# Patient Record
Sex: Female | Born: 1995 | Race: White | Hispanic: No | Marital: Single | Smoking: Never smoker
Health system: Southern US, Community
[De-identification: ages and names within clinical notes are randomized; demographics above are authoritative.]

## PROBLEM LIST (undated history)

## (undated) DIAGNOSIS — E282 Polycystic ovarian syndrome: Secondary | ICD-10-CM

## (undated) DIAGNOSIS — A1801 Tuberculosis of spine: Secondary | ICD-10-CM

## (undated) HISTORY — PX: MOUTH SURGERY: SHX715

---

## 2016-02-12 ENCOUNTER — Emergency Department: Payer: Worker's Compensation

## 2016-02-12 ENCOUNTER — Emergency Department
Admission: EM | Admit: 2016-02-12 | Discharge: 2016-02-12 | Disposition: A | Discharge status: Home / Self Care | Payer: Worker's Compensation | Attending: Emergency Medicine | Admitting: Emergency Medicine

## 2016-02-12 ENCOUNTER — Encounter: Payer: Self-pay | Admitting: Emergency Medicine

## 2016-02-12 DIAGNOSIS — Y9389 Activity, other specified: Secondary | ICD-10-CM | POA: Insufficient documentation

## 2016-02-12 DIAGNOSIS — S8392XA Sprain of unspecified site of left knee, initial encounter: Secondary | ICD-10-CM | POA: Insufficient documentation

## 2016-02-12 DIAGNOSIS — Y9269 Other specified industrial and construction area as the place of occurrence of the external cause: Secondary | ICD-10-CM | POA: Diagnosis not present

## 2016-02-12 DIAGNOSIS — W108XXA Fall (on) (from) other stairs and steps, initial encounter: Secondary | ICD-10-CM | POA: Diagnosis not present

## 2016-02-12 DIAGNOSIS — Y990 Civilian activity done for income or pay: Secondary | ICD-10-CM | POA: Diagnosis not present

## 2016-02-12 DIAGNOSIS — S8992XA Unspecified injury of left lower leg, initial encounter: Secondary | ICD-10-CM | POA: Diagnosis present

## 2016-02-12 DIAGNOSIS — Y929 Unspecified place or not applicable: Secondary | ICD-10-CM | POA: Diagnosis not present

## 2016-02-12 DIAGNOSIS — S8002XA Contusion of left knee, initial encounter: Secondary | ICD-10-CM

## 2016-02-12 MED ORDER — IBUPROFEN 800 MG PO TABS: 800.0000 mg | ORAL_TABLET | Freq: Three times a day (TID) | ORAL | Status: DC | PRN

## 2016-02-12 MED ORDER — IBUPROFEN 800 MG PO TABS
800.0000 mg | ORAL_TABLET | Freq: Once | ORAL | Status: DC
  Filled 2016-02-12: qty 1

## 2016-02-12 MED ORDER — OXYCODONE-ACETAMINOPHEN 5-325 MG PO TABS: 1.0000 | ORAL_TABLET | Freq: Four times a day (QID) | ORAL | Status: DC | PRN

## 2016-02-12 NOTE — ED Provider Notes (Signed)
[facility] Emergency Department Provider Note   ____________________________________________  Time seen: Approximately 4:50 PM  I have reviewed the triage vital signs and the nursing notes.   HISTORY  Chief Complaint Knee Pain     HPI Martha Villegas is a 20 y.o. female who fell earlier today at work, [employer], injuring her left knee. Unable to bear weight.She fell into a bar that circles a stepladder. Pain with flexion. Also loud cracking with flexion. No pain to her ankle or hip. No prior history of knee problems, however she does remember having some crepitus to her knee with flexion in the past.   History reviewed. No pertinent past medical history.  There are no active problems to display for this patient.   Past Surgical History  Procedure Laterality Date  . Mouth surgery      Current Outpatient Rx  Name  Route  Sig  Dispense  Refill  . ibuprofen (ADVIL,MOTRIN) 800 MG tablet   Oral   Take 1 tablet (800 mg total) by mouth every 8 (eight) hours as needed.   15 tablet   0   . oxyCODONE-acetaminophen (ROXICET) 5-325 MG tablet   Oral   Take 1 tablet by mouth every 6 (six) hours as needed.   10 tablet   0     Allergies Review of patient's allergies indicates no known allergies.  No family history on file.  Social History Social History  Substance Use Topics  . Smoking status: Never Smoker   . Smokeless tobacco: None  . Alcohol Use: No    Review of Systems Constitutional: No fever/chills Eyes: No visual changes. ENT: No sore throat. Cardiovascular: Denies chest pain. Respiratory: Denies shortness of breath. Gastrointestinal: No abdominal pain.  No nausea, no vomiting.  No diarrhea.  No constipation. Genitourinary: Negative for dysuria. Musculoskeletal: Negative for back pain. Skin: Negative for rash. Neurological: Negative for headaches, focal weakness or numbness. 10-point ROS otherwise  negative.  ____________________________________________   PHYSICAL EXAM:  VITAL SIGNS: ED Triage Vitals  Enc Vitals Group     BP 02/12/16 1533 130/72 mmHg     Pulse Rate 02/12/16 1533 100     Resp 02/12/16 1533 16     Temp 02/12/16 1533 98.8 F (37.1 C)     Temp Source 02/12/16 1533 Oral     SpO2 02/12/16 1533 100 %     Weight 02/12/16 1533 200 lb (90.719 kg)     Height 02/12/16 1533  (1.676 m)     Head Cir --      Peak Flow --      Pain Score 02/12/16 1544 5     Pain Loc --      Pain Edu? --      Excl. in GC? --     Constitutional: Alert and oriented. Well appearing and in no acute distress. Eyes: Conjunctivae are normal.  Mouth/Throat: Mucous membranes are moist.   Musculoskeletal: Left knee: Tender to the medial aspect of the patella with crepitus on range of motion. Mild medial and lateral joint line tenderness. Able to flex to 120. Minimal patellar tenderness with full flexion. Neurologic:  Normal speech and language. No gross focal neurologic deficits are appreciated. No gait instability. Skin:  Skin is warm, dry and intact. No rash noted. Psychiatric: Mood and affect are normal. Speech and behavior are normal.  ____________________________________________   LABS (all labs ordered are listed, but only abnormal results are displayed)  Labs Reviewed - No data  to display ____________________________________________  EKG   ____________________________________________  RADIOLOGY  CLINICAL DATA: 20 year old female with acute anterior left knee pain following fall today. Initial encounter.  EXAM: LEFT KNEE - COMPLETE 4+ VIEW  COMPARISON: None.  FINDINGS: No evidence of fracture, dislocation, or joint effusion. No evidence of arthropathy or other focal bone abnormality. Soft tissues are unremarkable.  IMPRESSION: Negative.   Electronically Signed  By: Harmon PierJeffrey Hu M.D.  On: 02/12/2016  16:19 ____________________________________________   PROCEDURES  Procedure(s) performed: None  Critical Care performed: No  ____________________________________________   INITIAL IMPRESSION / ASSESSMENT AND PLAN / ED COURSE  Pertinent labs & imaging results that were available during my care of the patient were reviewed by me and considered in my medical decision making (see chart for details).  20 year old female who injured her left knee this afternoon at work. Negative x-rays as above. Suffered a contusion with a knee sprain. Placed in a knee immobilizer. Continue ice and ibuprofen. Also given crutches. He can follow-up with orthopedics next week. Given a work note through Monday. 02/14/16.  ____________________________________________   FINAL CLINICAL IMPRESSION(S) / ED DIAGNOSES  Final diagnoses:  Knee contusion, left, initial encounter  Knee sprain, left, initial encounter      Ignacia BayleyRobert Zhamir Pirro, PA-C 02/12/16 1709  Jeanmarie PlantJames A McShane, MD 02/12/16 [phone]

## 2016-02-12 NOTE — ED Notes (Signed)
Patient presents to the ED with left knee pain.  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  Patient is in no obvious distress at this time.  Limping on knee.

## 2016-02-12 NOTE — ED Notes (Signed)
E sig pad not working, pt verbalized understanding 

## 2016-02-12 NOTE — Discharge Instructions (Signed)
Contusion A contusion is a deep bruise. Contusions are the result of a blunt injury to tissues and muscle fibers under the skin. The injury causes bleeding under the skin. The skin overlying the contusion may turn blue, purple, or yellow. Minor injuries will give you a painless contusion, but more severe contusions may stay painful and swollen for a few weeks.  CAUSES  This condition is usually caused by a blow, trauma, or direct force to an area of the body. SYMPTOMS  Symptoms of this condition include:  Swelling of the injured area.  Pain and tenderness in the injured area.  Discoloration. The area may have redness and then turn blue, purple, or yellow. DIAGNOSIS  This condition is diagnosed based on a physical exam and medical history. An X-ray, CT scan, or MRI may be needed to determine if there are any associated injuries, such as broken bones (fractures). TREATMENT  Specific treatment for this condition depends on what area of the body was injured. In general, the best treatment for a contusion is resting, icing, applying pressure to (compression), and elevating the injured area. This is often called the RICE strategy. Over-the-counter anti-inflammatory medicines may also be recommended for pain control.  HOME CARE INSTRUCTIONS   Rest the injured area.  If directed, apply ice to the injured area:  Put ice in a plastic bag.  Place a towel between your skin and the bag.  Leave the ice on for 20 minutes, 2-3 times per day.  If directed, apply light compression to the injured area using an elastic bandage. Make sure the bandage is not wrapped too tightly. Remove and reapply the bandage as directed by your health care provider.  If possible, raise (elevate) the injured area above the level of your heart while you are sitting or lying down.  Take over-the-counter and prescription medicines only as told by your health care provider. SEEK MEDICAL CARE IF:  Your symptoms do not  improve after several days of treatment.  Your symptoms get worse.  You have difficulty moving the injured area. SEEK IMMEDIATE MEDICAL CARE IF:   You have severe pain.  You have numbness in a hand or foot.  Your hand or foot turns pale or cold.   This information is not intended to replace advice given to you by your health care provider. Make sure you discuss any questions you have with your health care provider.   Document Released: 06/07/2005 Document Revised: 05/19/2015 Document Reviewed: 01/13/2015 Elsevier Interactive Patient Education 2016 [employer]  Knee Sprain A knee sprain is a tear in the strong bands of tissue that connect the bones (ligaments) of your knee. HOME CARE  Raise (elevate) your injured knee to lessen puffiness (swelling).  To ease pain and puffiness, put ice on the injured area.  Put ice in a plastic bag.  Place a towel between your skin and the bag.  Leave the ice on for 20 minutes, 2-3 times a day.  Only take medicine as told by your doctor.  Do not leave your knee unprotected until pain and stiffness go away (usually 4-6 weeks).  If you have a cast or splint, do not get it wet. If your doctor told you to not take it off, cover it with a plastic bag when you shower or bathe. Do not swim.  Your doctor may have you do exercises to prevent or limit permanent weakness and stiffness. GET HELP RIGHT AWAY IF:   Your cast or splint becomes damaged.  Your pain gets worse.  You have a lot of pain, puffiness, or numbness below the cast or splint. MAKE SURE YOU:   Understand these instructions.  Will watch your condition.  Will get help right away if you are not doing well or get worse.   This information is not intended to replace advice given to you by your health care provider. Make sure you discuss any questions you have with your health care provider.   Document Released: 08/16/2009 Document Revised: 09/02/2013 Document Reviewed:  05/06/2013 Elsevier Interactive Patient Education 2016 Elsevier Inc.   Continue ice, ibuprofen and knee immobilizer. Contact the orthopedics next week for a follow-up appointment. Return to emergency for any concerns.

## 2016-02-12 NOTE — ED Notes (Signed)
Workers comp Completed.

## 2016-02-12 NOTE — ED Notes (Addendum)
Pt states she banged her knee on a step ladder around 2pm today while at work.  Pt states the pain subsides as she elevates it.  Pt can bear some weight to knee. Pt also states she cannot swallow pill.

## 2019-01-14 ENCOUNTER — Other Ambulatory Visit: Payer: Self-pay | Admitting: Family Medicine

## 2019-01-14 DIAGNOSIS — R591 Generalized enlarged lymph nodes: Secondary | ICD-10-CM

## 2019-01-20 ENCOUNTER — Ambulatory Visit
Admission: RE | Admit: 2019-01-20 | Discharge: 2019-01-20 | Disposition: A | Discharge status: Home / Self Care | Payer: 59 | Source: Ambulatory Visit | Attending: Family Medicine | Admitting: Family Medicine

## 2019-01-20 ENCOUNTER — Other Ambulatory Visit: Payer: Self-pay

## 2019-01-20 DIAGNOSIS — R591 Generalized enlarged lymph nodes: Secondary | ICD-10-CM | POA: Insufficient documentation

## 2019-05-12 IMAGING — US SOFT TISSUE ULTRASOUND HEAD/NECK
1 series · 14 of 25 positions shown · non-contrast
Comparison: None.

CLINICAL DATA: 23-year-old female with a history of lymphadenopathy

EXAM:
ULTRASOUND OF HEAD/NECK SOFT TISSUES
TECHNIQUE: Ultrasound examination of the head and neck soft tissues was
performed in the area of clinical concern.

[Series 1: soft tissue ultrasound head/neck · 0.07mm/px · 14 of 25 slices shown]
[im 1/25]
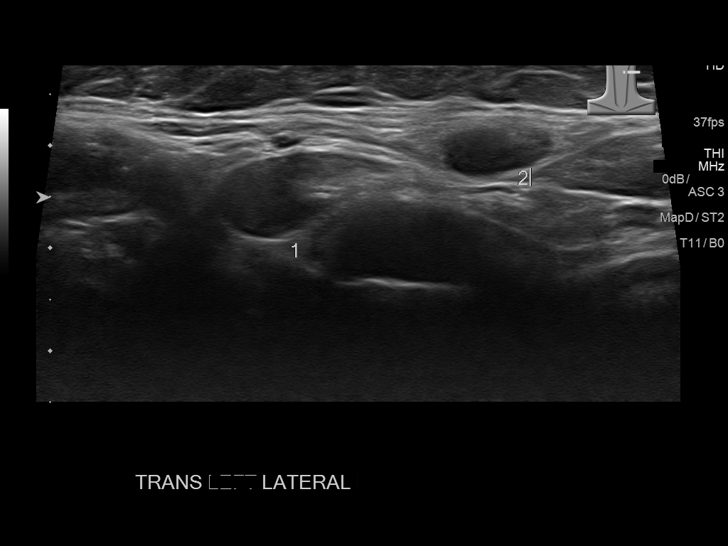
[im 3/25]
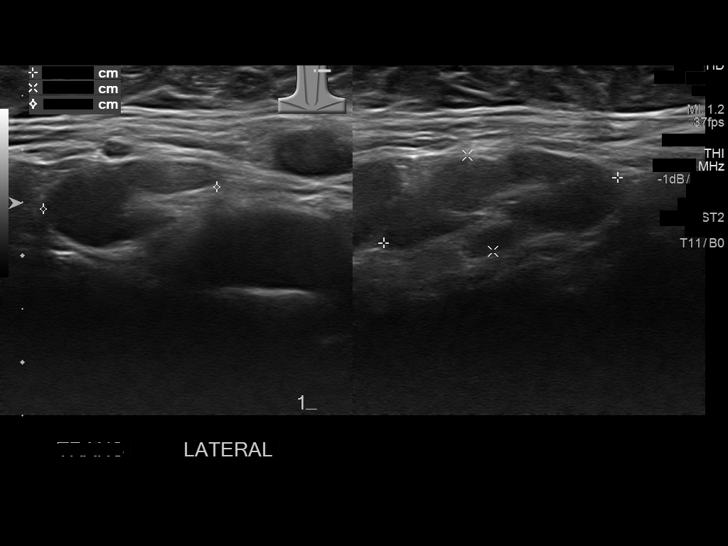
[im 5/25]
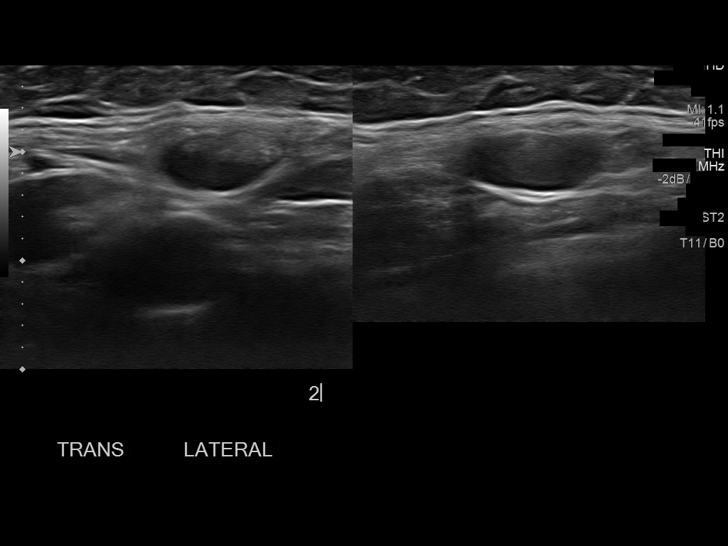
[im 7/25]
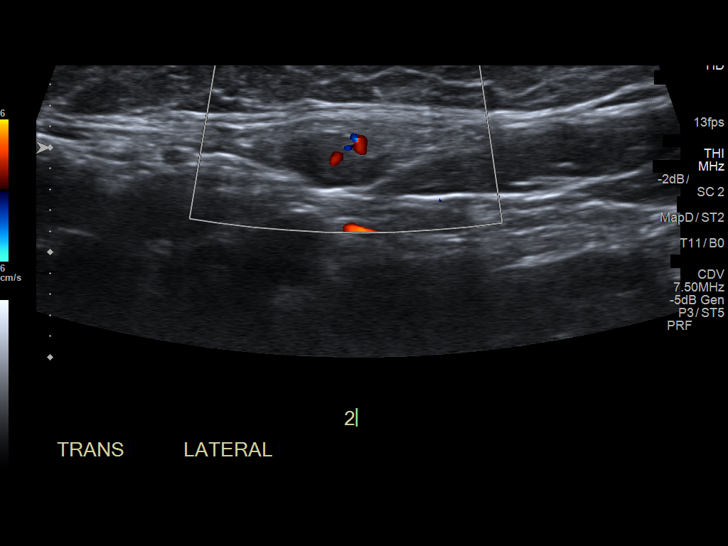
[im 9/25]
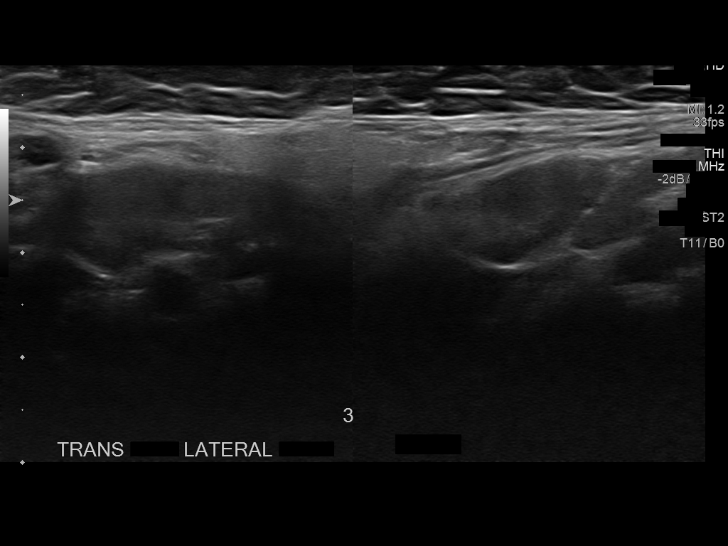
[im 10/25]
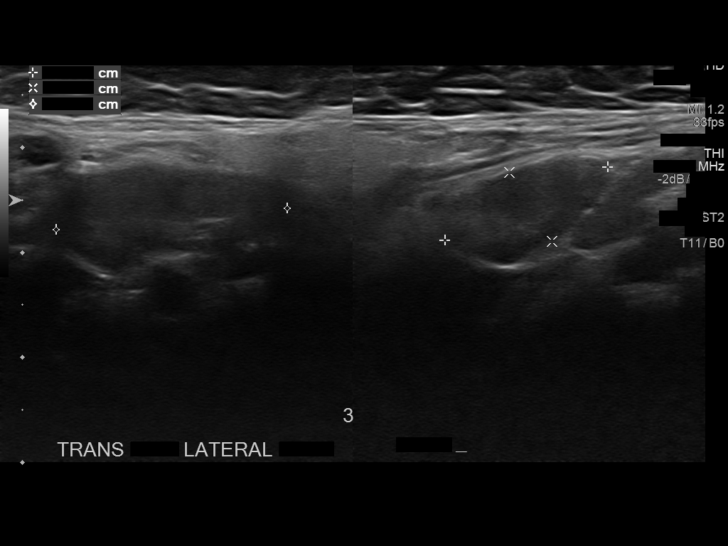
[im 12/25]
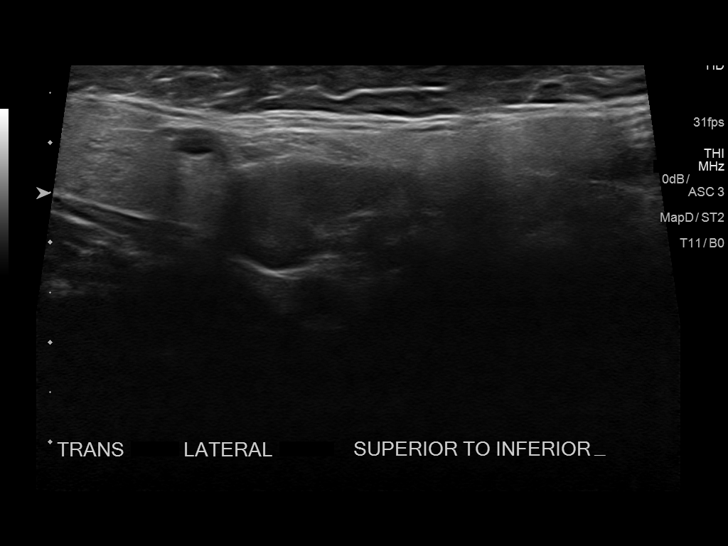
[im 14/25]
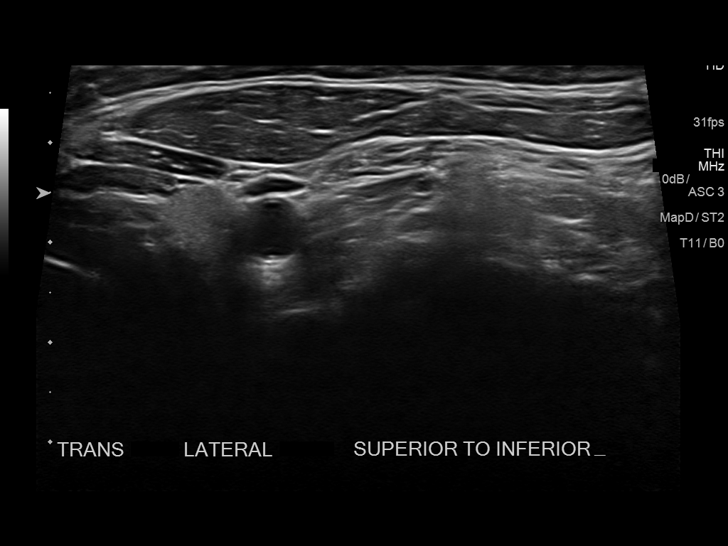
[im 16/25]
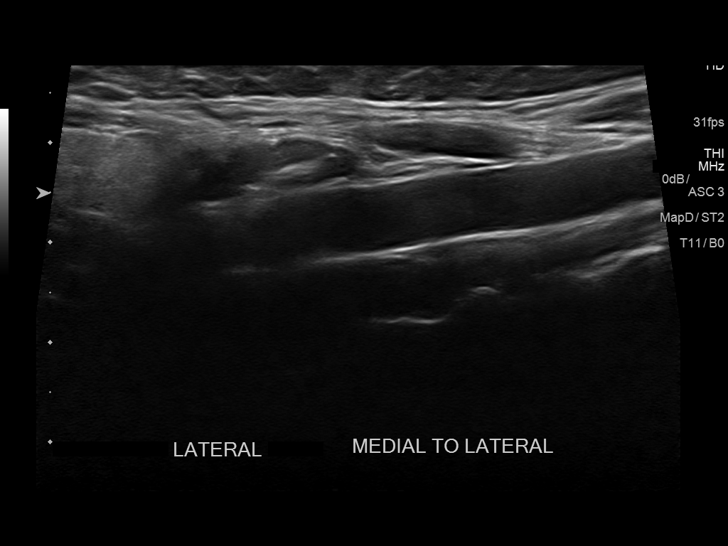
[im 17/25]
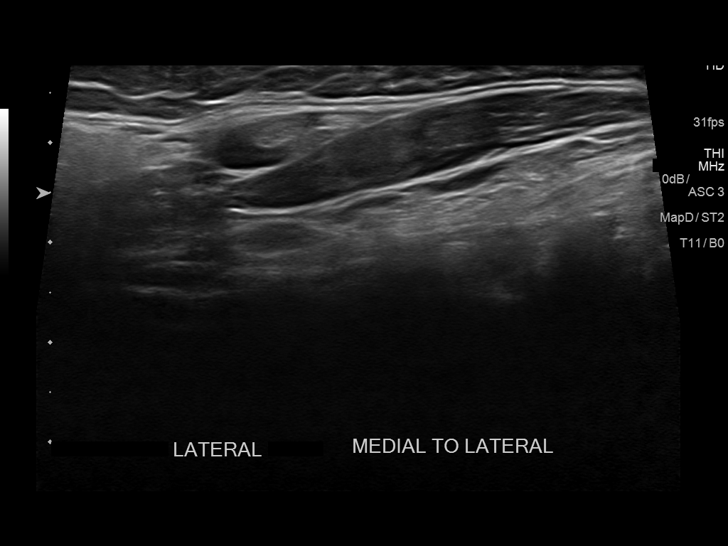
[im 19/25]
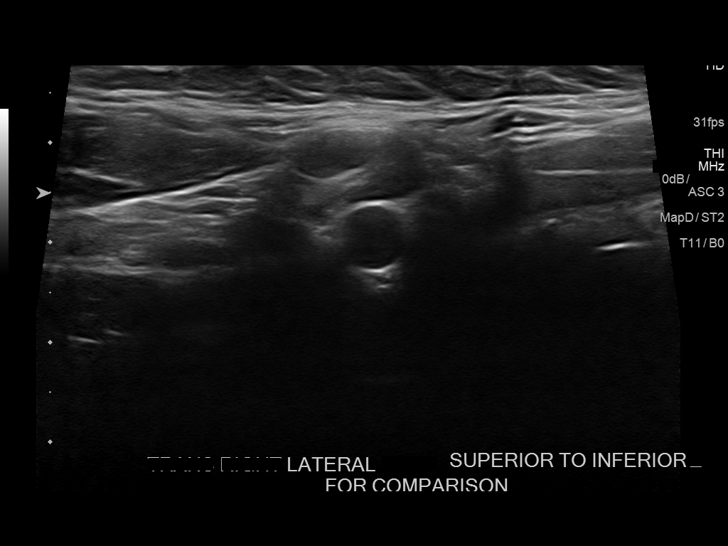
[im 21/25]
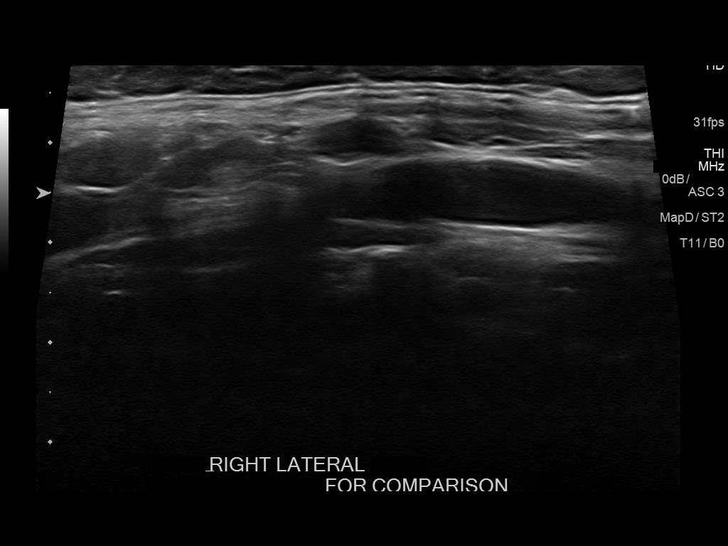
[im 23/25]
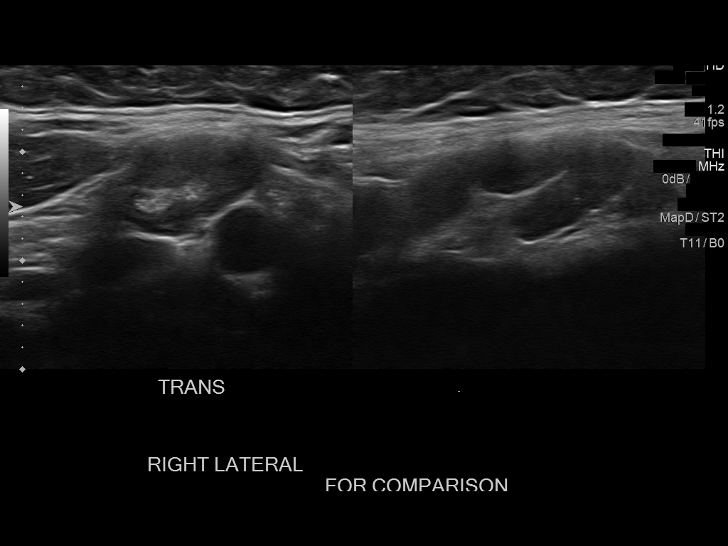
[im 25/25]
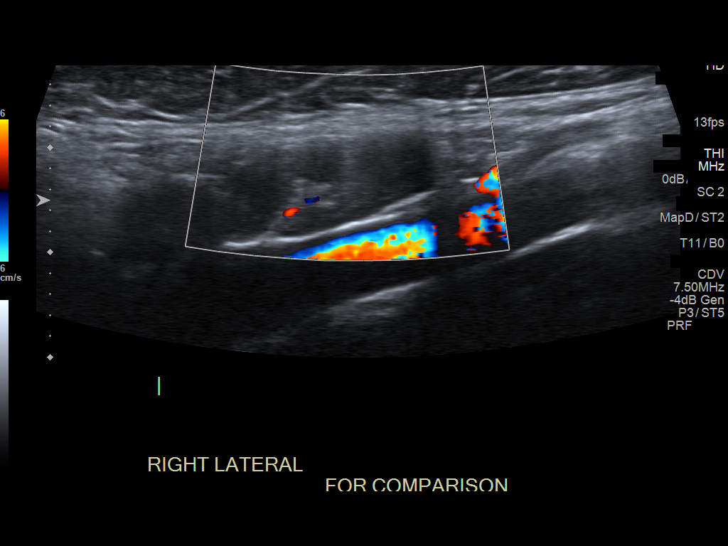

[14 of 25 positions shown; findings below may reference images not displayed]

FINDINGS: Grayscale and color duplex performed in the region of clinical
concern. No focal fluid. No soft tissue lesion.

Left-sided lymph nodes borderline enlarged with typical architecture
maintained. Single right-sided cervical node measured, borderline
enlarged.
IMPRESSION: Sonographic survey demonstrates bilateral cervical lymph nodes, more
numerous on the left and the right, borderline enlarged with typical
architecture maintained. These are nonspecific, and may potentially
be reactive. Correlation with presentation and lab values may be
useful if there is concern for lymphoproliferative disorder

## 2020-11-30 ENCOUNTER — Other Ambulatory Visit: Payer: Self-pay

## 2020-11-30 ENCOUNTER — Ambulatory Visit
Admission: EM | Admit: 2020-11-30 | Discharge: 2020-11-30 | Disposition: A | Discharge status: Home / Self Care | Payer: 59 | Attending: Sports Medicine | Admitting: Sports Medicine

## 2020-11-30 DIAGNOSIS — R059 Cough, unspecified: Secondary | ICD-10-CM

## 2020-11-30 DIAGNOSIS — J301 Allergic rhinitis due to pollen: Secondary | ICD-10-CM

## 2020-11-30 DIAGNOSIS — R0981 Nasal congestion: Secondary | ICD-10-CM

## 2020-11-30 DIAGNOSIS — J069 Acute upper respiratory infection, unspecified: Secondary | ICD-10-CM

## 2020-11-30 MED ORDER — BENZONATATE 100 MG PO CAPS: 200.0000 mg | ORAL_CAPSULE | Freq: Three times a day (TID) | ORAL | 0 refills | Status: DC | PRN

## 2020-11-30 NOTE — ED Triage Notes (Signed)
Patient states that she has been having sinus pain and pressure, headaches, dizziness, cough and runny nose. States that she is now having left upper quadrant abdominal pain.

## 2020-11-30 NOTE — ED Provider Notes (Signed)
[facility]    CSN: [id number] Arrival date & time: 11/30/20  0808      History   Chief Complaint Chief Complaint  Patient presents with  . Facial Pain    HPI Martha Villegas is a 25 y.o. female.   Patient pleasant 25 year old female who presents for evaluation of the above issues.  She reports upper respiratory symptoms with sinus pressure, nasal congestion, drainage, rhinorrhea, ear pressure, throat irritation, and cough with postnasal drip.  The cough is not productive.  No fever shakes chills.  She says she feels a little bit nauseous from the postnasal drip.  No vomiting or diarrhea.  No fever shakes chills.  No COVID exposure or COVID history.  She has been vaccinated x2 but no booster.  No flu shot.  She also reports a little bit of left-sided lower chest pain over the 11th and 12th ribs.  It only occurs when she coughs.  She also reports some seasonal allergies but she does not take any medications for it.  She is requesting a work note and she works at [employer].  She did have a home COVID test 2 weeks ago and does not want to pursue that today.  No chest pain or shortness of breath.  No red flag signs or symptoms elicited on history.     History reviewed. No pertinent past medical history.  There are no problems to display for this patient.   Past Surgical History:  Procedure Laterality Date  . MOUTH SURGERY      OB History   No obstetric history on file.      Home Medications    Prior to Admission medications   Medication Sig Start Date End Date Taking? Authorizing Provider  benzonatate (TESSALON) 100 MG capsule Take 2 capsules (200 mg total) by mouth 3 (three) times daily as needed for cough. 11/30/20  Yes Delton See, MD  ibuprofen (ADVIL,MOTRIN) 800 MG tablet Take 1 tablet (800 mg total) by mouth every 8 (eight) hours as needed. 02/12/16   Ignacia Bayley, PA-C  oxyCODONE-acetaminophen (ROXICET) 5-325 MG tablet Take 1 tablet by mouth every 6 (six)  hours as needed. 02/12/16   Ignacia Bayley, PA-C    Family History Family History  Problem Relation Age of Onset  . Healthy Mother   . Healthy Father     Social History Social History   Tobacco Use  . Smoking status: Never Smoker  . Smokeless tobacco: Never Used  Vaping Use  . Vaping Use: Some days  . Substances: CBD  Substance Use Topics  . Alcohol use: No  . Drug use: Never     Allergies   Patient has no known allergies.   Review of Systems Review of Systems  Constitutional: Negative for activity change, appetite change, chills, diaphoresis, fatigue and fever.  HENT: Positive for congestion, ear pain, postnasal drip, rhinorrhea, sinus pressure and sore throat. Negative for ear discharge, sinus pain and sneezing.   Eyes: Negative.  Negative for pain.  Respiratory: Positive for cough. Negative for chest tightness, shortness of breath, wheezing and stridor.        Left-sided lower rib pain anteriorly with coughing.  No real chest pain or chest tightness.  Cardiovascular: Negative.  Negative for chest pain and palpitations.  Gastrointestinal: Negative.  Negative for abdominal pain, constipation, diarrhea, nausea and vomiting.  Genitourinary: Negative.  Negative for dysuria, flank pain, frequency, hematuria, pelvic pain, urgency, vaginal bleeding, vaginal discharge and vaginal pain.  Musculoskeletal: Negative.  Negative  for arthralgias, back pain, myalgias, neck pain and neck stiffness.  Skin: Negative.  Negative for color change, rash and wound.  Neurological: Negative.  Negative for dizziness, syncope, light-headedness, numbness and headaches.  All other systems reviewed and are negative.    Physical Exam Triage Vital Signs ED Triage Vitals  Enc Vitals Group     BP 11/30/20 0817 129/86     Pulse Rate 11/30/20 0817 90     Resp 11/30/20 0817 18     Temp 11/30/20 0817 98.7 F (37.1 C)     Temp Source 11/30/20 0817 Oral     SpO2 11/30/20 0817 100 %     Weight 11/30/20  0818 199 lb 15.3 oz (90.7 kg)     Height 11/30/20 0818 5\' 6"  (1.676 m)     Head Circumference --      Peak Flow --      Pain Score 11/30/20 0818 5     Pain Loc --      Pain Edu? --      Excl. in GC? --    No data found.  Updated Vital Signs BP 129/86 (BP Location: Right Arm)   Pulse 90   Temp 98.7 F (37.1 C) (Oral)   Resp 18   Ht 5\' 6"  (1.676 m)   Wt 90.7 kg   LMP 11/16/2020   SpO2 100%   BMI 32.27 kg/m   Visual Acuity Right Eye Distance:   Left Eye Distance:   Bilateral Distance:    Right Eye Near:   Left Eye Near:    Bilateral Near:     Physical Exam Vitals and nursing note reviewed.  Constitutional:      General: She is not in acute distress.    Appearance: Normal appearance. She is not ill-appearing, toxic-appearing or diaphoretic.  HENT:     Head: Normocephalic and atraumatic.     Right Ear: Tympanic membrane normal.     Left Ear: Tympanic membrane normal.     Nose: Congestion and rhinorrhea present.     Mouth/Throat:     Mouth: Mucous membranes are moist.     Pharynx: No oropharyngeal exudate or posterior oropharyngeal erythema.  Eyes:     General: No scleral icterus.       Right eye: No discharge.        Left eye: No discharge.     Extraocular Movements: Extraocular movements intact.     Conjunctiva/sclera: Conjunctivae normal.     Pupils: Pupils are equal, round, and reactive to light.  Cardiovascular:     Rate and Rhythm: Normal rate.     Pulses: Normal pulses.     Heart sounds: Normal heart sounds. No murmur heard. No friction rub. No gallop.   Pulmonary:     Effort: Pulmonary effort is normal. No respiratory distress.     Breath sounds: Normal breath sounds. No stridor. No wheezing, rhonchi or rales.  Musculoskeletal:     Cervical back: Normal range of motion and neck supple. No rigidity or tenderness.  Lymphadenopathy:     Cervical: Cervical adenopathy present.  Skin:    General: Skin is warm and dry.     Capillary Refill: Capillary  refill takes less than 2 seconds.     Findings: No lesion or rash.  Neurological:     General: No focal deficit present.     Mental Status: She is alert and oriented to person, place, and time.      UC Treatments / Results  Labs (all labs ordered are listed, but only abnormal results are displayed) Labs Reviewed - No data to display  EKG   Radiology No results found.  Procedures Procedures (including critical care time)  Medications Ordered in UC Medications - No data to display  Initial Impression / Assessment and Plan / UC Course  I have reviewed the triage vital signs and the nursing notes.  Pertinent labs & imaging results that were available during my care of the patient were reviewed by me and considered in my medical decision making (see chart for details).  Clinical impression: Viral URI with nasal congestion, postnasal drip, throat irritation with a normal exam, and cough from the postnasal drip.  She also has anterior lower rib discomfort with coughing.  No shortness of breath or chest pain.  No abdominal issues either.  She also has what sounds like some seasonal allergies but does not take any medications.  Treatment plan: 1.  The findings and treatment plan were discussed in detail with the patient.  Patient was in agreement. 2.  We discussed doing a Covid test but she is self-pay and she deferred on this. 3.  Educational handouts were provided. 4.  Sent a prescription for [employer]. 5.  I recommended over-the-counter allergy medications. 6.  We discussed the diagnosis of sinusitis and she does not meet the criteria.  I will give her some educational handouts on this and she will do saline flushes.  I encouraged her to purchase a Nettie pot. 7.  Over-the-counter meds as needed, cough syrup such as Delsym or Robitussin.  Tylenol or Motrin for any fever or discomfort. 8.  I encouraged her to get into her primary care provider if her symptoms persisted. 9.   She requested a work note I provided 1 she will be out today and tomorrow. 10.  Follow-up here as needed.    Final Clinical Impressions(s) / UC Diagnoses   Final diagnoses:  Acute upper respiratory infection  Cough  Nasal congestion  Seasonal allergic rhinitis due to pollen     Discharge Instructions     Your exam is consistent with a viral URI. I provided educational handouts. I also sent a prescription for Tessalon Perles for your cough. Over-the-counter meds as needed, Tylenol or Motrin for fever discomfort.  I would encourage you to get some Delsym or Robitussin for your cough. I also would encourage you to get some allergy medication because you have some allergic rhinitis. Please follow-up with your primary care provider if your symptoms persist.  You do not meet the criteria for sinusitis and no antibiotic.  I have included some educational handouts on sinus rinses. I also provided you with a work note.  I hope you get the feeling better, Dr. Zachery Dauer    ED Prescriptions    Medication Sig Dispense Auth. Provider   benzonatate (TESSALON) 100 MG capsule Take 2 capsules (200 mg total) by mouth 3 (three) times daily as needed for cough. 21 capsule Delton See, MD     PDMP not reviewed this encounter.   Delton See, MD 11/30/20 [phone]

## 2020-11-30 NOTE — Discharge Instructions (Signed)
Your exam is consistent with a viral URI. I provided educational handouts. I also sent a prescription for Tessalon Perles for your cough. Over-the-counter meds as needed, Tylenol or Motrin for fever discomfort.  I would encourage you to get some Delsym or Robitussin for your cough. I also would encourage you to get some allergy medication because you have some allergic rhinitis. Please follow-up with your primary care provider if your symptoms persist.  You do not meet the criteria for sinusitis and no antibiotic.  I have included some educational handouts on sinus rinses. I also provided you with a work note.  I hope you get the feeling better, Dr. Zachery Dauer

## 2021-08-26 ENCOUNTER — Ambulatory Visit
Admission: EM | Admit: 2021-08-26 | Discharge: 2021-08-26 | Disposition: A | Discharge status: Home / Self Care | Payer: 59 | Attending: Emergency Medicine | Admitting: Emergency Medicine

## 2021-08-26 ENCOUNTER — Other Ambulatory Visit: Payer: Self-pay

## 2021-08-26 DIAGNOSIS — J111 Influenza due to unidentified influenza virus with other respiratory manifestations: Secondary | ICD-10-CM

## 2021-08-26 MED ORDER — IPRATROPIUM BROMIDE 0.06 % NA SOLN: 2.0000 | Freq: Four times a day (QID) | NASAL | 12 refills | Status: AC

## 2021-08-26 MED ORDER — PROMETHAZINE-DM 6.25-15 MG/5ML PO SYRP: 5.0000 mL | ORAL_SOLUTION | Freq: Four times a day (QID) | ORAL | 0 refills | Status: AC | PRN

## 2021-08-26 MED ORDER — BENZONATATE 100 MG PO CAPS: 200.0000 mg | ORAL_CAPSULE | Freq: Three times a day (TID) | ORAL | 0 refills | Status: AC

## 2021-08-26 MED ORDER — OSELTAMIVIR PHOSPHATE 6 MG/ML PO SUSR: 75.0000 mg | Freq: Two times a day (BID) | ORAL | 0 refills | Status: AC

## 2021-08-26 NOTE — ED Provider Notes (Signed)
[facility]    CSN: [id number] Arrival date & time: 08/26/21  0808      History   Chief Complaint Chief Complaint  Patient presents with   Fever    HPI Martha Villegas is a 25 y.o. female.   HPI  History reviewed. No pertinent past medical history.  There are no problems to display for this patient.   25 year old female here for evaluation of flulike symptoms.  Patient reports that for last 3 days she has been experiencing a fever with a T-max of 102, sore throat, headache, body aches, runny nose and nasal congestion, cough that is intermittently productive for yellow sputum, shortness of breath, and some nausea.  She is attributing the nausea to the DayQuil that she takes as it always makes her nauseous.  She has had some diarrhea.  She denies ear pain, wheezing, or vomiting.  Past Surgical History:  Procedure Laterality Date   MOUTH SURGERY      OB History   No obstetric history on file.      Home Medications    Prior to Admission medications   Medication Sig Start Date End Date Taking? Authorizing Provider  benzonatate (TESSALON) 100 MG capsule Take 2 capsules (200 mg total) by mouth every 8 (eight) hours. 08/26/21  Yes Becky Augusta, NP  ipratropium (ATROVENT) 0.06 % nasal spray Place 2 sprays into both nostrils 4 (four) times daily. 08/26/21  Yes Becky Augusta, NP  oseltamivir (TAMIFLU) 6 MG/ML SUSR suspension Take 12.5 mLs (75 mg total) by mouth 2 (two) times daily for 5 days. 08/26/21 08/31/21 Yes Becky Augusta, NP  promethazine-dextromethorphan (PROMETHAZINE-DM) 6.25-15 MG/5ML syrup Take 5 mLs by mouth 4 (four) times daily as needed. 08/26/21  Yes Becky Augusta, NP    Family History Family History  Problem Relation Age of Onset   Healthy Mother    Healthy Father     Social History Social History   Tobacco Use   Smoking status: Never   Smokeless tobacco: Never  Vaping Use   Vaping Use: Some days   Substances: CBD  Substance Use Topics    Alcohol use: No   Drug use: Never     Allergies   Patient has no known allergies.   Review of Systems Review of Systems  Constitutional:  Positive for fever. Negative for activity change and appetite change.  HENT:  Positive for congestion, postnasal drip, rhinorrhea and sore throat. Negative for ear pain.   Respiratory:  Positive for cough and shortness of breath. Negative for wheezing.   Gastrointestinal:  Positive for diarrhea and nausea. Negative for vomiting.  Musculoskeletal:  Positive for arthralgias and myalgias.  Skin:  Negative for rash.  Neurological:  Positive for headaches.  Hematological: Negative.   Psychiatric/Behavioral: Negative.      Physical Exam Triage Vital Signs ED Triage Vitals  Enc Vitals Group     BP 08/26/21 0819 119/85     Pulse Rate 08/26/21 0819 (!) 102     Resp 08/26/21 0819 18     Temp 08/26/21 0819 100 F (37.8 C)     Temp Source 08/26/21 0819 Oral     SpO2 08/26/21 0819 100 %     Weight --      Height 08/26/21 0819 5\' 6"  (1.676 m)     Head Circumference --      Peak Flow --      Pain Score 08/26/21 0818 7     Pain Loc --  Pain Edu? --      Excl. in GC? --    No data found.  Updated Vital Signs BP 119/85 (BP Location: Left Arm)    Pulse (!) 102    Temp 100 F (37.8 C) (Oral)    Resp 18    Ht 5\' 6"  (1.676 m)    LMP  (LMP Unknown)    SpO2 100%    BMI 32.27 kg/m   Visual Acuity Right Eye Distance:   Left Eye Distance:   Bilateral Distance:    Right Eye Near:   Left Eye Near:    Bilateral Near:     Physical Exam Vitals and nursing note reviewed.  Constitutional:      General: She is not in acute distress.    Appearance: Normal appearance. She is ill-appearing.  HENT:     Head: Normocephalic and atraumatic.     Right Ear: Tympanic membrane, ear canal and external ear normal. There is no impacted cerumen.     Left Ear: Tympanic membrane, ear canal and external ear normal. There is no impacted cerumen.     Nose:  Congestion and rhinorrhea present.     Mouth/Throat:     Mouth: Mucous membranes are moist.     Pharynx: Oropharynx is clear. Posterior oropharyngeal erythema present.  Cardiovascular:     Rate and Rhythm: Normal rate and regular rhythm.     Pulses: Normal pulses.     Heart sounds: Normal heart sounds. No murmur heard.   No gallop.  Pulmonary:     Effort: Pulmonary effort is normal.     Breath sounds: Normal breath sounds. No wheezing, rhonchi or rales.  Musculoskeletal:     Cervical back: Normal range of motion and neck supple.  Lymphadenopathy:     Cervical: No cervical adenopathy.  Skin:    General: Skin is warm and dry.     Capillary Refill: Capillary refill takes less than 2 seconds.     Findings: No erythema or rash.  Neurological:     General: No focal deficit present.     Mental Status: She is alert and oriented to person, place, and time.  Psychiatric:        Mood and Affect: Mood normal.        Behavior: Behavior normal.        Thought Content: Thought content normal.        Judgment: Judgment normal.     UC Treatments / Results  Labs (all labs ordered are listed, but only abnormal results are displayed) Labs Reviewed - No data to display  EKG   Radiology No results found.  Procedures Procedures (including critical care time)  Medications Ordered in UC Medications - No data to display  Initial Impression / Assessment and Plan / UC Course  I have reviewed the triage vital signs and the nursing notes.  Pertinent labs & imaging results that were available during my care of the patient were reviewed by me and considered in my medical decision making (see chart for details).  Patient is a very pleasant though ill-appearing 25 year old female here for evaluation of influenza-like symptoms as outlined HPI above.  On her physical exam she has pearly-gray tympanic membranes bilaterally with normal light reflex and clear external auditory canals.  Nasal mucosa is  edematous and mildly erythematous with clear nasal discharge in both nares.  Oropharyngeal exam reveals mild posterior oropharyngeal erythema with clear postnasal drip.  No cervical lymphadenopathy appreciated on exam.  Cardiopulmonary  exam feels clung sounds in all fields.  Patient's husband had similar symptoms but he was never tested and his symptoms have resolved.  Patient symptoms are consistent with influenza and I will treat her with Tamiflu twice daily for 5 days.  Patient is requesting liquid as she states that she cannot physically swallow pills as explained by her orthodontist because she has dental crowding and a small jaw and a large tongue.  Patient does not have any difficulty swallowing food per her report.  I have prescribed Tamiflu liquid form for the patient and have advised her that it may make her nausea worse.  I have also prescribed Atrovent nasal spray and Promethazine DM cough syrup.  I will prescribe Tessalon Ronal Fear today as well.  I have advised the patient that if she takes a mouthful of water retains fluid, drops the Tessalon Perles are often swallows she may be able to tolerate swallowing that gelcap without difficulty.  I also talked to her about desensitizing her gag reflex using her toothbrush.  If she is unable take the [employer] she can use Delsym, Zarbee's, or Robitussin during the day as needed for cough symptoms.  Also Tylenol and ibuprofen as needed for fever and body aches.  Work note provided.   Final Clinical Impressions(s) / UC Diagnoses   Final diagnoses:  Influenza-like illness     Discharge Instructions      Take the Tamiflu twice daily for 5 days for treatment of influenza.  Use the Atrovent nasal spray, 2 squirts up each nostril every 6 hours, as needed for nasal congestion and runny nose.  Use over-the-counter Delsym, Zarbee's, or Robitussin during the day as needed for cough.  Use the Tessalon Perles every 8 hours as needed for cough.   Taken with a small sip of water.  You may experience some numbness to your tongue or metallic taste in her mouth, this is normal.  Use the Promethazine DM cough syrup at bedtime as will make you drowsy but it should help dry up your postnasal drip and aid you in sleep and cough relief.  Return for reevaluation, or see your primary care provider, for new or worsening symptoms.      ED Prescriptions     Medication Sig Dispense Auth. Provider   oseltamivir (TAMIFLU) 6 MG/ML SUSR suspension Take 12.5 mLs (75 mg total) by mouth 2 (two) times daily for 5 days. 125 mL Becky Augusta, NP   benzonatate (TESSALON) 100 MG capsule Take 2 capsules (200 mg total) by mouth every 8 (eight) hours. 21 capsule Becky Augusta, NP   ipratropium (ATROVENT) 0.06 % nasal spray Place 2 sprays into both nostrils 4 (four) times daily. 15 mL Becky Augusta, NP   promethazine-dextromethorphan (PROMETHAZINE-DM) 6.25-15 MG/5ML syrup Take 5 mLs by mouth 4 (four) times daily as needed. 118 mL Becky Augusta, NP      PDMP not reviewed this encounter.   Becky Augusta, NP 08/26/21 [phone]

## 2021-08-26 NOTE — Discharge Instructions (Signed)
Take the Tamiflu twice daily for 5 days for treatment of influenza.  Use the Atrovent nasal spray, 2 squirts up each nostril every 6 hours, as needed for nasal congestion and runny nose.  Use over-the-counter Delsym, Zarbee's, or Robitussin during the day as needed for cough.  Use the Tessalon Perles every 8 hours as needed for cough.  Taken with a small sip of water.  You may experience some numbness to your tongue or metallic taste in her mouth, this is normal.  Use the Promethazine DM cough syrup at bedtime as will make you drowsy but it should help dry up your postnasal drip and aid you in sleep and cough relief.  Return for reevaluation, or see your primary care provider, for new or worsening symptoms.  

## 2021-08-26 NOTE — ED Triage Notes (Signed)
Pt here with C/O fever (102), cough and sore throat for 3 days. Husband was sick with same SX never got tested for the flu.

## 2023-12-12 ENCOUNTER — Ambulatory Visit: Admission: EM | Admit: 2023-12-12 | Discharge: 2023-12-12 | Disposition: A | Discharge status: Home / Self Care

## 2023-12-12 ENCOUNTER — Ambulatory Visit (INDEPENDENT_AMBULATORY_CARE_PROVIDER_SITE_OTHER)

## 2023-12-12 DIAGNOSIS — M25572 Pain in left ankle and joints of left foot: Secondary | ICD-10-CM

## 2023-12-12 DIAGNOSIS — M7732 Calcaneal spur, left foot: Secondary | ICD-10-CM

## 2023-12-12 DIAGNOSIS — M7750 Other enthesopathy of unspecified foot: Secondary | ICD-10-CM | POA: Diagnosis not present

## 2023-12-12 MED ORDER — IBUPROFEN 100 MG/5ML PO SUSP: 600.0000 mg | Freq: Three times a day (TID) | ORAL | 0 refills | Status: AC

## 2023-12-12 NOTE — ED Triage Notes (Signed)
 Pt reports she has had left ankle pain x 2 days making it difficult to walk. Pt denies injury

## 2023-12-12 NOTE — Discharge Instructions (Signed)
ANKLE PAIN: Stressed avoiding painful activities . Reviewed RICE guidelines. Use medications as directed, including NSAIDs. If no NSAIDs have been prescribed for you today, you may take Aleve or Motrin over the counter. May use Tylenol in between doses of NSAIDs.  If no improvement in the next 1-2 weeks, f/u with PCP or return to our office for reexamination, and please feel free to call or return at any time for any questions or concerns you may have and we will be happy to help you!

## 2023-12-12 NOTE — ED Provider Notes (Signed)
 [facility]    CSN: [id number] Arrival date & time: 12/12/23  1314      History   Chief Complaint No chief complaint on file.   HPI Martha Villegas is a 28 y.o. female presenting for left ankle pain for the past 2 days.  Patient states the pain started when she was just walking.  Denies fall or injury.  States she has a lot of pain in her heel and the anterior ankle.  No weakness, numbness.  Pain is worse with weightbearing but she is able to do so.  Has not taken any OTC meds.  Patient denies any new shoes or history of ankle pain.  Patient reports she is supposed to work tomorrow and she does not think she can.  Asked for work note.  HPI  History reviewed. No pertinent past medical history.  There are no active problems to display for this patient.   Past Surgical History:  Procedure Laterality Date   MOUTH SURGERY      OB History   No obstetric history on file.      Home Medications    Prior to Admission medications   Medication Sig Start Date End Date Taking? Authorizing Provider  ibuprofen (ADVIL) 100 MG/5ML suspension Take 30 mLs (600 mg total) by mouth every 8 (eight) hours for 7 days. 12/12/23 12/19/23 Yes Shirlee Latch, PA-C  Norethin-Eth Estradiol-Fe [facility] FE) 0.4-35 MG-MCG tablet Chew 1 tablet by mouth daily.   Yes [provider]  benzonatate (TESSALON) 100 MG capsule Take 2 capsules (200 mg total) by mouth every 8 (eight) hours. 08/26/21   Becky Augusta, NP  ipratropium (ATROVENT) 0.06 % nasal spray Place 2 sprays into both nostrils 4 (four) times daily. 08/26/21   Becky Augusta, NP  promethazine-dextromethorphan (PROMETHAZINE-DM) 6.25-15 MG/5ML syrup Take 5 mLs by mouth 4 (four) times daily as needed. 08/26/21   Becky Augusta, NP    Family History Family History  Problem Relation Age of Onset   Healthy Mother    Healthy Father     Social History Social History   Tobacco Use   Smoking status: Never   Smokeless tobacco: Never   Vaping Use   Vaping status: Some Days   Substances: CBD  Substance Use Topics   Alcohol use: No   Drug use: Never     Allergies   Tomato   Review of Systems Review of Systems  Musculoskeletal:  Positive for arthralgias. Negative for gait problem and joint swelling.  Skin:  Negative for color change, rash and wound.  Neurological:  Negative for weakness and numbness.     Physical Exam Triage Vital Signs ED Triage Vitals [12/12/23 1340]  Encounter Vitals Group     BP 130/84     Systolic BP Percentile      Diastolic BP Percentile      Pulse Rate 89     Resp 16     Temp 98 F (36.7 C)     Temp Source Oral     SpO2 99 %     Weight      Height      Head Circumference      Peak Flow      Pain Score 6     Pain Loc      Pain Education      Exclude from Growth Chart    No data found.  Updated Vital Signs BP 130/84 (BP Location: Left Arm)   Pulse 89  Temp 98 F (36.7 C) (Oral)   Resp 16   LMP 11/28/2023 (Approximate)   SpO2 99%      Physical Exam Vitals and nursing note reviewed.  Constitutional:      General: She is not in acute distress.    Appearance: Normal appearance. She is not ill-appearing or toxic-appearing.  HENT:     Head: Normocephalic and atraumatic.  Eyes:     General: No scleral icterus.       Right eye: No discharge.        Left eye: No discharge.     Conjunctiva/sclera: Conjunctivae normal.  Cardiovascular:     Rate and Rhythm: Normal rate.     Pulses: Normal pulses.  Pulmonary:     Effort: Pulmonary effort is normal. No respiratory distress.  Musculoskeletal:     Cervical back: Neck supple.     Comments: LEFT FOOT/ANKLE: No associated swelling. TTP anterior ankle diffusely and TTP heel. Painful plantarflexion and dorsiflexion. Good pulses and strength.   Skin:    General: Skin is dry.  Neurological:     General: No focal deficit present.     Mental Status: She is alert. Mental status is at baseline.     Motor: No weakness.      Gait: Gait normal.  Psychiatric:        Mood and Affect: Mood normal.        Behavior: Behavior normal.      UC Treatments / Results  Labs (all labs ordered are listed, but only abnormal results are displayed) Labs Reviewed - No data to display  EKG   Radiology DG Ankle Complete Left Result Date: 12/12/2023 CLINICAL DATA:  Left ankle pain for 2 days EXAM: LEFT ANKLE COMPLETE - 3+ VIEW COMPARISON:  None Available. FINDINGS: Frontal, oblique, and lateral views of the left ankle are obtained. No acute fracture, subluxation, or dislocation. Joint spaces are well preserved. Large inferior calcaneal spur. Soft tissues are unremarkable. IMPRESSION: 1. Large inferior calcaneal spur. 2. No acute bony abnormality. Electronically Signed   By: Sharlet Salina M.D.   On: 12/12/2023 15:38    Procedures Procedures (including critical care time)  Medications Ordered in UC Medications - No data to display  Initial Impression / Assessment and Plan / UC Course  I have reviewed the triage vital signs and the nursing notes.  Pertinent labs & imaging results that were available during my care of the patient were reviewed by me and considered in my medical decision making (see chart for details).   28 year old female presents for 2-day history of atraumatic left ankle pain.  Pain began when she was walking.  No OTC meds tried.  X-ray of left ankle obtained.  Reviewed by me.  There is evidence of heel spur.  Discussed this with patient.  Will contact her with official results of x-ray shows any abnormalities we did not discuss.  Offered ketorolac injection but she declines.  Request a liquid medication to help with pain relief.  I sent ibuprofen suspension to pharmacy.  Patient was given an Aircast.  Reviewed RICE guidelines.  Work note provided.  Reviewed return and Ortho precautions.  X-ray shows calcaneus spur.  No change to treatment plan.    Final Clinical Impressions(s) / UC Diagnoses   Final  diagnoses:  Acute left ankle pain  Calcaneal spur of left foot  Tendonitis of ankle     Discharge Instructions      ANKLE PAIN: Stressed avoiding painful activities .  Reviewed RICE guidelines. Use medications as directed, including NSAIDs. If no NSAIDs have been prescribed for you today, you may take Aleve or Motrin over the counter. May use Tylenol in between doses of NSAIDs.  If no improvement in the next 1-2 weeks, f/u with PCP or return to our office for reexamination, and please feel free to call or return at any time for any questions or concerns you may have and we will be happy to help you!         ED Prescriptions     Medication Sig Dispense Auth. Provider   ibuprofen (ADVIL) 100 MG/5ML suspension Take 30 mLs (600 mg total) by mouth every 8 (eight) hours for 7 days. 473 mL Shirlee Latch, PA-C      PDMP not reviewed this encounter.   Shirlee Latch, PA-C 12/12/23 1610

## 2024-07-12 ENCOUNTER — Emergency Department

## 2024-07-12 ENCOUNTER — Emergency Department
Admission: EM | Admit: 2024-07-12 | Discharge: 2024-07-12 | Disposition: A | Discharge status: Home / Self Care | Attending: Emergency Medicine | Admitting: Emergency Medicine

## 2024-07-12 ENCOUNTER — Other Ambulatory Visit: Payer: Self-pay

## 2024-07-12 DIAGNOSIS — R519 Headache, unspecified: Secondary | ICD-10-CM | POA: Insufficient documentation

## 2024-07-12 DIAGNOSIS — R002 Palpitations: Secondary | ICD-10-CM | POA: Insufficient documentation

## 2024-07-12 DIAGNOSIS — R000 Tachycardia, unspecified: Secondary | ICD-10-CM | POA: Diagnosis present

## 2024-07-12 HISTORY — DX: Tuberculosis of spine: A18.01

## 2024-07-12 HISTORY — DX: Polycystic ovarian syndrome: E28.2

## 2024-07-12 LAB — BASIC METABOLIC PANEL WITH GFR
Anion gap: 10 (ref 5–15)
BUN: 10 mg/dL (ref 6–20)
CO2: 24 mmol/L (ref 22–32)
Calcium: 9.1 mg/dL (ref 8.9–10.3)
Chloride: 105 mmol/L (ref 98–111)
Creatinine, Ser: 0.8 mg/dL (ref 0.44–1.00)
GFR, Estimated: >60 mL/min (ref >60)
Glucose, Bld: 91 mg/dL (ref 70–99)
Potassium: 4.1 mmol/L (ref 3.5–5.1)
Sodium: 139 mmol/L (ref 135–145)

## 2024-07-12 LAB — POC URINE PREG, ED: Preg Test, Ur: NEGATIVE

## 2024-07-12 LAB — CBC
HCT: 39.7 % (ref 36.0–46.0)
Hemoglobin: 13.3 g/dL (ref 12.0–15.0)
MCH: 30.3 pg (ref 26.0–34.0)
MCHC: 33.5 g/dL (ref 30.0–36.0)
MCV: 90.4 fL (ref 80.0–100.0)
Platelets: 279 K/uL (ref 150–400)
RBC: 4.39 MIL/uL (ref 3.87–5.11)
RDW: 12.4 % (ref 11.5–15.5)
WBC: 7.7 K/uL (ref 4.0–10.5)
nRBC: 0 % (ref 0.0–0.2)

## 2024-07-12 LAB — TROPONIN I (HIGH SENSITIVITY): Troponin I (High Sensitivity): <2 ng/L (ref <18)

## 2024-07-12 MED ORDER — PROCHLORPERAZINE EDISYLATE 10 MG/2ML IJ SOLN
10.0000 mg | Freq: Once | INTRAMUSCULAR | Status: AC
  Administered 2024-07-12: 10 mg via INTRAVENOUS
  Filled 2024-07-12: qty 2

## 2024-07-12 MED ORDER — LACTATED RINGERS IV BOLUS
1000.0000 mL | Freq: Once | INTRAVENOUS | Status: AC
  Administered 2024-07-12: 1000 mL via INTRAVENOUS

## 2024-07-12 MED ORDER — DIPHENHYDRAMINE HCL 50 MG/ML IJ SOLN
25.0000 mg | Freq: Once | INTRAMUSCULAR | Status: AC
  Administered 2024-07-12: 25 mg via INTRAVENOUS
  Filled 2024-07-12: qty 1

## 2024-07-12 NOTE — ED Notes (Signed)
 IV in R AC infiltrated with LR infusion. Pharmacy notified and they report no interventions needed. IV removed and ACE bandage applied. Distal 2+ radial pulse with normal sensation intact. Pt instructed to watch for warmth, redness, or other signs of infection and return to ED if noted or if swelling doesn't resolve.

## 2024-07-12 NOTE — ED Triage Notes (Signed)
 Pt to ED for tachycardia since around 1pm, states hx POTTS and HR is 126 per her smart watch. No CP, states dizziness and nausea.

## 2024-07-12 NOTE — ED Notes (Signed)
 Pt completely assessed by EDP and set to discharge. No RN assessment performed.  Pt provided discharge instructions and prescription information. Pt was given the opportunity to ask questions and questions were answered.

## 2024-07-12 NOTE — ED Provider Notes (Signed)
 [facility] Provider Note    Event Date/Time   First MD Initiated Contact with Patient 07/12/24 1654     (approximate)   History   Chief Complaint Tachycardia   HPI  Martha Villegas is a 28 y.o. female with past medical history of PCOS and POTS who presents to the ED complaining of tachycardia.  Patient reports that around 1PM she began to feel lightheaded and her Apple Watch told her that her heart was racing with a heart rate around 140.  She began to feel shaky with the symptoms, developed a headache about an hour later with some nausea.  She denies any vision changes, speech changes, numbness, or weakness.  She states that symptoms do not feel similar to prior POTS related episodes or anxiety.  She has not had any pain in her chest or difficulty breathing, states that she had been feeling well prior to the onset of symptoms with no fevers, cough, vomiting, diarrhea, or dysuria.  Her LMP was approximately 2 weeks ago.     Physical Exam   Triage Vital Signs: ED Triage Vitals  Encounter Vitals Group     BP 07/12/24 1627 (!) 156/99     Girls Systolic BP Percentile --      Girls Diastolic BP Percentile --      Boys Systolic BP Percentile --      Boys Diastolic BP Percentile --      Pulse Rate 07/12/24 1627 (!) 120     Resp 07/12/24 1627 20     Temp 07/12/24 1627 98.1 F (36.7 C)     Temp Source 07/12/24 1627 Oral     SpO2 07/12/24 1627 96 %     Weight 07/12/24 1626 265 lb (120.2 kg)     Height 07/12/24 1626 5' 6 (1.676 m)     Head Circumference --      Peak Flow --      Pain Score 07/12/24 1624 5     Pain Loc --      Pain Education --      Exclude from Growth Chart --     Most recent vital signs: Vitals:   07/12/24 1627 07/12/24 1658  BP: (!) 156/99 135/87  Pulse: (!) 120 (!) 111  Resp: 20 (!) 22  Temp: 98.1 F (36.7 C)   SpO2: 96%     Constitutional: Alert and oriented. Eyes: Conjunctivae are normal. Head: Atraumatic. Nose: No  congestion/rhinnorhea. Mouth/Throat: Mucous membranes are moist.  Neck: Supple with no meningismus. Cardiovascular: Tachycardic, regular rhythm. Grossly normal heart sounds.  2+ radial pulses bilaterally. Respiratory: Normal respiratory effort.  No retractions. Lungs CTAB. Gastrointestinal: Soft and nontender. No distention. Musculoskeletal: No lower extremity tenderness nor edema.  Neurologic:  Normal speech and language. No gross focal neurologic deficits are appreciated.    ED Results / Procedures / Treatments   Labs (all labs ordered are listed, but only abnormal results are displayed) Labs Reviewed  BASIC METABOLIC PANEL WITH GFR  CBC  POC URINE PREG, ED  TROPONIN I (HIGH SENSITIVITY)     EKG  ED ECG REPORT I, Carlin Palin, the attending physician, personally viewed and interpreted this ECG.   Date: 07/12/2024  EKG Time: 16:27  Rate: 110  Rhythm: sinus tachycardia  Axis: LAD  Intervals:none  ST&T Change: None  PROCEDURES:  Critical Care performed: No  Procedures   MEDICATIONS ORDERED IN ED: Medications  prochlorperazine (COMPAZINE) injection 10 mg (10 mg Intravenous Given 07/12/24 1721)  diphenhydrAMINE (BENADRYL) injection 25 mg (25 mg Intravenous Given 07/12/24 1727)  lactated ringers bolus 1,000 mL (1,000 mLs Intravenous New Bag/Given 07/12/24 1734)     IMPRESSION / MDM / ASSESSMENT AND PLAN / ED COURSE  I reviewed the triage vital signs and the nursing notes.                              28 y.o. female with past medical history of PCOS and POTS who presents to the ED complaining of palpitations, nausea, lightheadedness, and headache starting about 4 hours ago.  Patient's presentation is most consistent with acute presentation with potential threat to life or bodily function.  Differential diagnosis includes, but is not limited to, arrhythmia, ACS, anemia, electrolyte abnormality, AKI, migraine headache, tension headache, meningitis, SAH,  anxiety.  Patient well-appearing and in no acute distress, vital signs remarkable for tachycardia but otherwise reassuring.  EKG shows sinus tachycardia with no evidence of arrhythmia or ischemia.  Patient has a nonfocal neurologic exam and no features concerning for meningitis.  Labs without significant anemia, leukocytosis, electrolyte abnormality, or AKI and troponin within normal limits.  We will treat symptomatically with IV Compazine and Benadryl, and shared decision making, patient is agreeable to hold off on CT head to see how she responds to symptomatic treatment.  Patient initially stated that she was not feeling any better following medications and CT imaging of her head was ordered.  However, prior to CT imaging being completed, patient stated that headache was resolving and she was no longer feeling shaky.  She seems to have improved with IV fluids and with otherwise reassuring workup, is appropriate for outpatient management.  She was counseled to follow-up with her PCP and to return to the ED for new or worsening symptoms, patient agrees with plan.      FINAL CLINICAL IMPRESSION(S) / ED DIAGNOSES   Final diagnoses:  Palpitations  Acute nonintractable headache, unspecified headache type     Rx / DC Orders   ED Discharge Orders     None        Note:  This document was prepared using Dragon voice recognition software and may include unintentional dictation errors.   Willo Dunnings, MD 07/12/24 [phone]

## 2024-10-16 ENCOUNTER — Other Ambulatory Visit: Payer: Self-pay | Admitting: Internal Medicine

## 2024-10-16 DIAGNOSIS — R0989 Other specified symptoms and signs involving the circulatory and respiratory systems: Secondary | ICD-10-CM

## 2024-10-16 DIAGNOSIS — G90A Postural orthostatic tachycardia syndrome (POTS): Secondary | ICD-10-CM

## 2024-10-16 DIAGNOSIS — R1312 Dysphagia, oropharyngeal phase: Secondary | ICD-10-CM

## 2024-10-22 ENCOUNTER — Ambulatory Visit
